# Patient Record
Sex: Female | Born: 2000 | Race: White | Hispanic: No | Marital: Single | Smoking: Never smoker
Health system: Southern US, Community
[De-identification: ages and names within clinical notes are randomized; demographics above are authoritative.]

## PROBLEM LIST (undated history)

## (undated) DIAGNOSIS — Q030 Malformations of aqueduct of Sylvius: Secondary | ICD-10-CM

---

## 2019-04-13 ENCOUNTER — Ambulatory Visit: Payer: Managed Care, Other (non HMO) | Attending: Internal Medicine

## 2019-04-13 DIAGNOSIS — Z20822 Contact with and (suspected) exposure to covid-19: Secondary | ICD-10-CM

## 2019-04-14 LAB — NOVEL CORONAVIRUS, NAA: SARS-CoV-2, NAA: NOT DETECTED

## 2021-02-27 ENCOUNTER — Encounter (HOSPITAL_BASED_OUTPATIENT_CLINIC_OR_DEPARTMENT_OTHER): Payer: Self-pay | Admitting: Emergency Medicine

## 2021-02-27 ENCOUNTER — Other Ambulatory Visit: Payer: Self-pay

## 2021-02-27 ENCOUNTER — Emergency Department (HOSPITAL_BASED_OUTPATIENT_CLINIC_OR_DEPARTMENT_OTHER)
Admission: EM | Admit: 2021-02-27 | Discharge: 2021-02-27 | Disposition: A | Discharge status: Home / Self Care | Payer: BC Managed Care – PPO | Attending: Emergency Medicine | Admitting: Emergency Medicine

## 2021-02-27 ENCOUNTER — Emergency Department (HOSPITAL_BASED_OUTPATIENT_CLINIC_OR_DEPARTMENT_OTHER): Payer: BC Managed Care – PPO

## 2021-02-27 DIAGNOSIS — R1013 Epigastric pain: Secondary | ICD-10-CM | POA: Diagnosis not present

## 2021-02-27 DIAGNOSIS — R112 Nausea with vomiting, unspecified: Secondary | ICD-10-CM | POA: Diagnosis not present

## 2021-02-27 HISTORY — DX: Malformations of aqueduct of Sylvius: Q03.0

## 2021-02-27 LAB — CBC
HCT: 48 % — ABNORMAL HIGH (ref 36.0–46.0)
Hemoglobin: 16.4 g/dL — ABNORMAL HIGH (ref 12.0–15.0)
MCH: 27.8 pg (ref 26.0–34.0)
MCHC: 34.2 g/dL (ref 30.0–36.0)
MCV: 81.5 fL (ref 80.0–100.0)
Platelets: 320 10*3/uL (ref 150–400)
RBC: 5.89 MIL/uL — ABNORMAL HIGH (ref 3.87–5.11)
RDW: 11.3 % — ABNORMAL LOW (ref 11.5–15.5)
WBC: 9.6 10*3/uL (ref 4.0–10.5)
nRBC: 0 % (ref 0.0–0.2)

## 2021-02-27 LAB — URINALYSIS, ROUTINE W REFLEX MICROSCOPIC
Glucose, UA: NEGATIVE mg/dL
Hgb urine dipstick: NEGATIVE
Ketones, ur: 40 mg/dL — AB
Nitrite: NEGATIVE
Specific Gravity, Urine: 1.02 (ref 1.005–1.030)
pH: 5.5 (ref 5.0–8.0)

## 2021-02-27 LAB — LIPASE, BLOOD: Lipase: 26 U/L (ref 11–51)

## 2021-02-27 LAB — COMPREHENSIVE METABOLIC PANEL
ALT: 28 U/L (ref 0–44)
AST: 21 U/L (ref 15–41)
Albumin: 5.2 g/dL — ABNORMAL HIGH (ref 3.5–5.0)
Alkaline Phosphatase: 52 U/L (ref 38–126)
Anion gap: 15 (ref 5–15)
BUN: 9 mg/dL (ref 6–20)
CO2: 27 mmol/L (ref 22–32)
Calcium: 10.3 mg/dL (ref 8.9–10.3)
Chloride: 94 mmol/L — ABNORMAL LOW (ref 98–111)
Creatinine, Ser: 0.86 mg/dL (ref 0.44–1.00)
GFR, Estimated: >60 mL/min (ref >60)
Glucose, Bld: 81 mg/dL (ref 70–99)
Potassium: 3 mmol/L — ABNORMAL LOW (ref 3.5–5.1)
Sodium: 136 mmol/L (ref 135–145)
Total Bilirubin: 0.6 mg/dL (ref 0.3–1.2)
Total Protein: 8.1 g/dL (ref 6.5–8.1)

## 2021-02-27 LAB — PREGNANCY, URINE: Preg Test, Ur: NEGATIVE

## 2021-02-27 LAB — MAGNESIUM: Magnesium: 2.1 mg/dL (ref 1.7–2.4)

## 2021-02-27 MED ORDER — IOHEXOL 300 MG/ML  SOLN
100.0000 mL | Freq: Once | INTRAMUSCULAR | Status: AC | PRN
  Administered 2021-02-27: 100 mL via INTRAVENOUS

## 2021-02-27 MED ORDER — ONDANSETRON HCL 4 MG/2ML IJ SOLN
4.0000 mg | Freq: Once | INTRAMUSCULAR | Status: AC
  Administered 2021-02-27: 4 mg via INTRAVENOUS
  Filled 2021-02-27: qty 2

## 2021-02-27 MED ORDER — POTASSIUM CHLORIDE CRYS ER 20 MEQ PO TBCR: 30.0000 meq | EXTENDED_RELEASE_TABLET | Freq: Once | ORAL | Status: DC

## 2021-02-27 MED ORDER — LACTATED RINGERS IV BOLUS
1000.0000 mL | Freq: Once | INTRAVENOUS | Status: AC
  Administered 2021-02-27: 1000 mL via INTRAVENOUS

## 2021-02-27 MED ORDER — POTASSIUM CHLORIDE 10 MEQ/100ML IV SOLN
10.0000 meq | INTRAVENOUS | Status: AC
  Administered 2021-02-27 (×3): 10 meq via INTRAVENOUS
  Filled 2021-02-27 (×3): qty 100

## 2021-02-27 MED ORDER — POTASSIUM CHLORIDE CRYS ER 20 MEQ PO TBCR
20.0000 meq | EXTENDED_RELEASE_TABLET | Freq: Once | ORAL | Status: AC
  Administered 2021-02-27: 20 meq via ORAL
  Filled 2021-02-27: qty 1

## 2021-02-27 MED ORDER — OMEPRAZOLE 20 MG PO CPDR
20.0000 mg | DELAYED_RELEASE_CAPSULE | Freq: Every day | ORAL | 0 refills | Status: AC
  Filled 2021-02-27: qty 30, 30d supply, fill #0

## 2021-02-27 NOTE — ED Triage Notes (Signed)
Pt arrives to ED with c/o emesis and nausea. This started x14 days ago. Pt reports >5 episodes of emesis per day. Pt does report intermittent nausea. Associated symptoms include abdominal cramping.

## 2021-02-27 NOTE — ED Notes (Signed)
Pt verbalizes understanding of discharge instructions. Opportunity for questioning and answers were provided. Pt discharged from ED to home.   ? ?

## 2021-02-27 NOTE — ED Provider Notes (Signed)
Patient no longer vomiting tolerating p.o. intake.  Potassium repleted here in the ER with multiple doses.  Repeat EKG appears to show normalized QTC.  Recommending outpatient follow-up with a primary care doctor within the week, advised immediate return for worsening symptoms or any additional concerns.   Cheryll Cockayne, MD 02/27/21 2318

## 2021-02-27 NOTE — ED Notes (Signed)
To CT

## 2021-02-27 NOTE — Discharge Instructions (Addendum)
You were seen here today for evaluation of your nausea and vomiting. Your lab work showed low potassium, otherwise normal.  We replenished your potassium while you were here.  Your EKG showed a prolonged QT which can happen with antinausea medications.  After giving you more potassium this problem appears to have improved.   However, because of this, please discontinue taking any antinausea medication such as but not limited to Zofran, Reglan, Phenergan as these may prolong your QT more and can cause cardiac arrhythmias. You can sniff alcohol pads if feeling nauseous. I have prescribed you Prilosec to help with any abdominal pain. I have included the information to a GI practice. Please call to schedule an appointment. If you have any concern, new or worsening symptoms, please return to the ER.

## 2021-02-27 NOTE — ED Provider Notes (Signed)
[facility] Provider Note   CSN: [id number] Arrival date & time: 02/27/21  1627     History Chief Complaint  Patient presents with   Emesis    Tamara Wall is a 21 y.o. female presents to the ED for evaluation of nausea and vomiting despite Phenergan and Reglan for the past 14 days.  She reports least 5 episodes per day.  She does endorse some coffee-ground emesis for a few episodes over the duration of 2 weeks.  She does report some lightheadedness, but no syncope.  She reports she has had some epigastric pain with this duration of symptoms that is episodic.  She denies any chest pain, shortness of breath, urinary symptoms, fever, diarrhea, constipation, or dark or tarry stools.  The patient reports that she had a similar episode to this 6 months ago that required hospitalization where they did a full work-up and could not find the cause of her vomiting. She was not given follow up to a GI provider.  For medical history she has an aqueductal stenosis.  No surgical history.  Daily medications include magnesium.  No known drug allergies.  Denies any tobacco or EtOH.  She does endorse some marijuana use although not on a consistent basis.   Emesis Associated symptoms: abdominal pain   Associated symptoms: no arthralgias, no chills, no cough, no diarrhea, no fever and no sore throat       Home Medications Prior to Admission medications   Medication Sig Start Date End Date Taking? Authorizing Provider  omeprazole (PRILOSEC) 20 MG capsule Take 1 capsule (20 mg total) by mouth daily. 02/27/21  Yes Sherrell Puller, PA-C      Allergies    Patient has no known allergies.    Review of Systems   Review of Systems  Constitutional:  Negative for chills and fever.  HENT:  Negative for ear pain and sore throat.   Eyes:  Negative for pain and visual disturbance.  Respiratory:  Negative for cough and shortness of breath.   Cardiovascular:  Negative for chest  pain and palpitations.  Gastrointestinal:  Positive for abdominal pain, nausea and vomiting. Negative for blood in stool, constipation and diarrhea.  Genitourinary:  Negative for dysuria and hematuria.  Musculoskeletal:  Negative for arthralgias and back pain.  Skin:  Negative for color change and rash.  Neurological:  Negative for seizures and syncope.  All other systems reviewed and are negative.  Physical Exam Updated Vital Signs BP 117/78    Pulse 85    Temp 98.2 F (36.8 C) (Oral)    Resp 18    Ht 5\' 3"  (1.6 m)    Wt 65.8 kg    LMP  (Approximate)    SpO2 99%    BMI 25.69 kg/m  Physical Exam Vitals and nursing note reviewed.  Constitutional:      General: She is not in acute distress.    Appearance: Normal appearance. She is not toxic-appearing.  HENT:     Head: Normocephalic and atraumatic.     Nose: Nose normal.     Mouth/Throat:     Mouth: Mucous membranes are dry.     Pharynx: No oropharyngeal exudate or posterior oropharyngeal erythema.  Eyes:     General: No scleral icterus. Cardiovascular:     Rate and Rhythm: Normal rate and regular rhythm.  Pulmonary:     Effort: Pulmonary effort is normal. No respiratory distress.     Breath sounds: Normal breath sounds.  Abdominal:  General: Abdomen is flat. Bowel sounds are normal.     Palpations: Abdomen is soft.     Tenderness: There is abdominal tenderness. There is no guarding or rebound.     Comments: Mild epigastric tenderness palpation without guarding or rebound.  Normoactive bowel sounds.  Musculoskeletal:        General: No deformity.     Cervical back: Normal range of motion.  Skin:    General: Skin is warm and dry.  Neurological:     General: No focal deficit present.     Mental Status: She is alert. Mental status is at baseline.    ED Results / Procedures / Treatments   Labs (all labs ordered are listed, but only abnormal results are displayed) Labs Reviewed  COMPREHENSIVE METABOLIC PANEL - Abnormal;  Notable for the following components:      Result Value   Potassium 3.0 (*)    Chloride 94 (*)    Albumin 5.2 (*)    All other components within normal limits  CBC - Abnormal; Notable for the following components:   RBC 5.89 (*)    Hemoglobin 16.4 (*)    HCT 48.0 (*)    RDW 11.3 (*)    All other components within normal limits  URINALYSIS, ROUTINE W REFLEX MICROSCOPIC - Abnormal; Notable for the following components:   Bilirubin Urine SMALL (*)    Ketones, ur 40 (*)    Protein, ur TRACE (*)    Leukocytes,Ua SMALL (*)    All other components within normal limits  LIPASE, BLOOD  PREGNANCY, URINE  MAGNESIUM    EKG EKG Interpretation  Date/Time:  Monday February 27 2021 18:20:20 EST Ventricular Rate:  97 PR Interval:    QRS Duration: 99 QT Interval:  417 QTC Calculation: 530 R Axis:   106 Text Interpretation: Atrial fibrillation Borderline right axis deviation Abnormal T, consider ischemia, inferior leads Prolonged QT interval Confirmed by Thamas Jaegers ([id number]) on 02/27/2021 7:16:15 PM  Radiology CT ABDOMEN PELVIS W CONTRAST  Result Date: 02/27/2021 CLINICAL DATA:  Abdominal pain EXAM: CT ABDOMEN AND PELVIS WITH CONTRAST TECHNIQUE: Multidetector CT imaging of the abdomen and pelvis was performed using the standard protocol following bolus administration of intravenous contrast. CONTRAST:  131mL OMNIPAQUE IOHEXOL 300 MG/ML  SOLN COMPARISON:  None. FINDINGS: Lower chest: No acute abnormality. Hepatobiliary: Focal fatty infiltration seen at the falciform ligament. No suspicious focal liver abnormality is seen. No gallstones, gallbladder wall thickening, or biliary dilatation. Pancreas: Unremarkable. No pancreatic ductal dilatation or surrounding inflammatory changes. Spleen: Normal in size without focal abnormality. Adrenals/Urinary Tract: Bilateral adrenal glands are unremarkable. Kidneys enhance symmetrically with no evidence of hydronephrosis or nephrolithiasis. Small low-attenuation  renal lesions which are too small to completely characterize. Bladder is unremarkable. Stomach/Bowel: Stomach is within normal limits. Appendix appears normal. Areas of hyperdensity are scattered throughout the colon which are favored to be due to ingested material, possibly retained intraluminal contrast from prior procedure. No evidence of bowel wall thickening, distention, or inflammatory changes. Vascular/Lymphatic: No significant vascular findings are present. No enlarged abdominal or pelvic lymph nodes. Reproductive: Uterus and bilateral adnexa are unremarkable. Other: No abdominal wall hernia or abnormality. No abdominopelvic ascites. Musculoskeletal: No acute or significant osseous findings. IMPRESSION: No acute findings in the abdomen or pelvis. Electronically Signed   By: Yetta Glassman M.D.   On: 02/27/2021 18:26    Procedures Procedures  Normotensive, afebrile, normal pulse rate, satting 99% on room air without increased work of breathing.  Her  EKG does show QT prolongation.  Medications Ordered in ED Medications  potassium chloride 10 mEq in 100 mL IVPB (10 mEq Intravenous New Bag/Given 02/27/21 2106)  lactated ringers bolus 1,000 mL (0 mLs Intravenous Stopped 02/27/21 2001)  ondansetron (ZOFRAN) injection 4 mg (4 mg Intravenous Given 02/27/21 1743)  iohexol (OMNIPAQUE) 300 MG/ML solution 100 mL (100 mLs Intravenous Contrast Given 02/27/21 1808)  potassium chloride SA (KLOR-CON M) CR tablet 20 mEq (20 mEq Oral Given 02/27/21 08-08-1998)    ED Course/ Medical Decision Making/ A&P                           Medical Decision Making  21 year old female presents to the emergency department for evaluation of nausea and vomiting along with epigastric abdominal pain for the past 2 weeks.  Differential diagnosis includes is not limited to pancreatitis, cholecystitis, cholangitis, cholelithiasis, SBO, viral gastroenteritis, vomiting syndrome, cannabinoid induced hyperemesis syndrome.  Vital signs show blood  pressure is normal, afebrile, normal pulse rate, satting 99% on room air.  His exam shows mild epigastric tenderness palpation however there is no guarding or rebound.  Normoactive bowel sounds.  Dry mucous membranes.  Patient is well-appearing.  Labs and imaging ordered.  Labs show normal magnesium of 2.1.  Negative pregnancy test.  CMP shows hypokalemia at 3.0 decrease in chloride.  Normal LFTs.  Normal creatinine.  Lipase normal.  CBC shows hemoconcentration likely due to patient's dehydration from vomiting.  Urinalysis shows ketones with trace protein and small leukocytes consistent with dehydration again.  CT abdomen shows no acute findings in the abdomen or pelvis.  No SBO or fluid collection.  No inflammatory changes.  The patient's story and her supporting labs of dehydration, will give 1 L of LR and replenish her potassium with 20 mill equivalents orally as well as 10 mill equivalents per hour for the next 3 hours.  Zofran given.  EKG shows QT prolongation, so discontinued and additional antiemetics.  Discussed this with the patient and advised her to stop taking any Reglan, Phenergan or any other antiemetic medications she was given with her last discharge as is can increase her daily for ligation sending her into a cardiac arrhythmia.  Patient expresses understanding and states that she will stop.  On reevaluation, the patient reports she is feeling better after the fluids.  We will p.o. challenge.  She was given crackers and soda.  On reevaluation, the patient has not had any vomiting and does not endorse any nausea.  No vomiting.  At this time, the patient has passed her p.o. challenge has not had any emesis since arriving to the emergency department.  She is in need of an addition dose of potassium in an hour.  Will handoff patient at this time.  9:48 PM Care of [facility]  transferred to  Dr. Laverta Baltimore at the end of my shift as the patient will require reassessment once labs/imaging  have resulted. Patient presentation, ED course, and plan of care discussed with review of all pertinent labs and imaging. Please see his/her note for further details regarding further ED course and disposition. Plan at time of handoff is discharge home after third dose of potassium pending that she does ntot have any additional vomiting episodes. Protonix prescription sent in. This may be altered or completely changed at the discretion of the oncoming team pending results of further workup.  Final Clinical Impression(s) / ED Diagnoses Final diagnoses:  Nausea and vomiting,  unspecified vomiting type    Rx / DC Orders ED Discharge Orders          Ordered    omeprazole (PRILOSEC) 20 MG capsule  Daily        02/27/21 2138              Sherrell Puller, PA-C 02/27/21 2148    Luna Fuse, MD 02/27/21 2318

## 2021-02-28 ENCOUNTER — Other Ambulatory Visit (HOSPITAL_BASED_OUTPATIENT_CLINIC_OR_DEPARTMENT_OTHER): Payer: Self-pay

## 2021-03-14 ENCOUNTER — Other Ambulatory Visit (HOSPITAL_BASED_OUTPATIENT_CLINIC_OR_DEPARTMENT_OTHER): Payer: Self-pay

## 2021-04-06 ENCOUNTER — Other Ambulatory Visit: Payer: Self-pay | Admitting: Physician Assistant

## 2021-04-06 DIAGNOSIS — R112 Nausea with vomiting, unspecified: Secondary | ICD-10-CM

## 2021-04-13 ENCOUNTER — Ambulatory Visit
Admission: RE | Admit: 2021-04-13 | Discharge: 2021-04-13 | Disposition: A | Discharge status: Home / Self Care | Payer: BC Managed Care – PPO | Source: Ambulatory Visit | Attending: Physician Assistant | Admitting: Physician Assistant

## 2021-04-13 DIAGNOSIS — R112 Nausea with vomiting, unspecified: Secondary | ICD-10-CM

## 2023-03-01 IMAGING — US US ABDOMEN LIMITED
1 series · 14 of 25 positions shown · non-contrast
Comparison: CT abdomen pelvis 02/27/2021

CLINICAL DATA: Nausea and vomiting with right-sided abdominal pain
for several months.

EXAM:
ULTRASOUND ABDOMEN LIMITED RIGHT UPPER QUADRANT

[Series 1: us abdomen limited · 0.17mm/px · 14 of 45 slices shown]
[im 1/45]
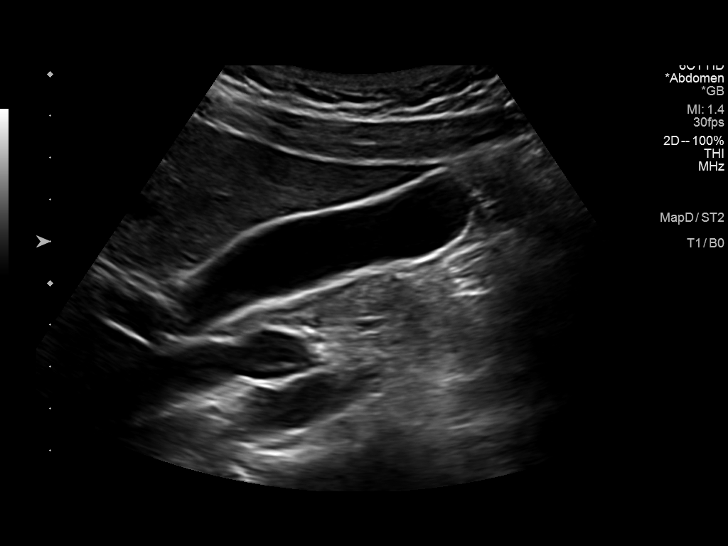
[im 4/45]
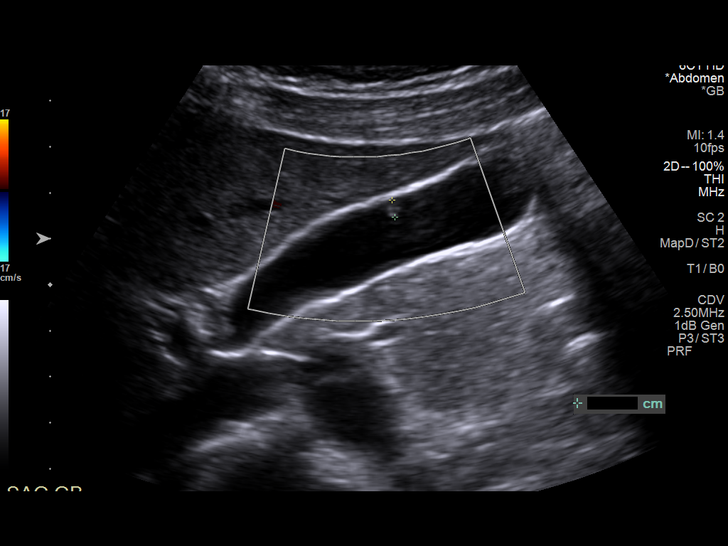
[im 8/45]
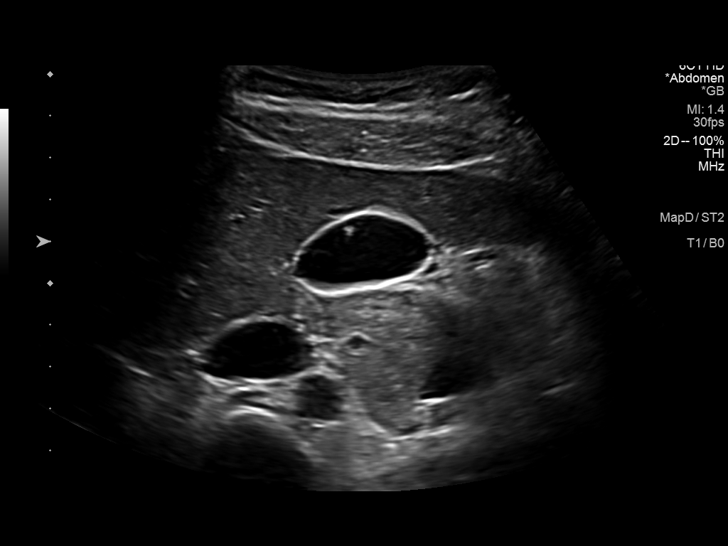
[im 12/45]
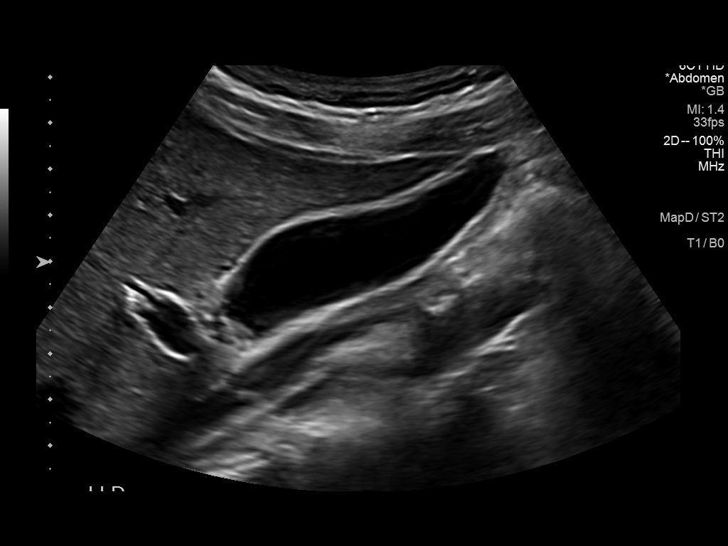
[im 15/45]
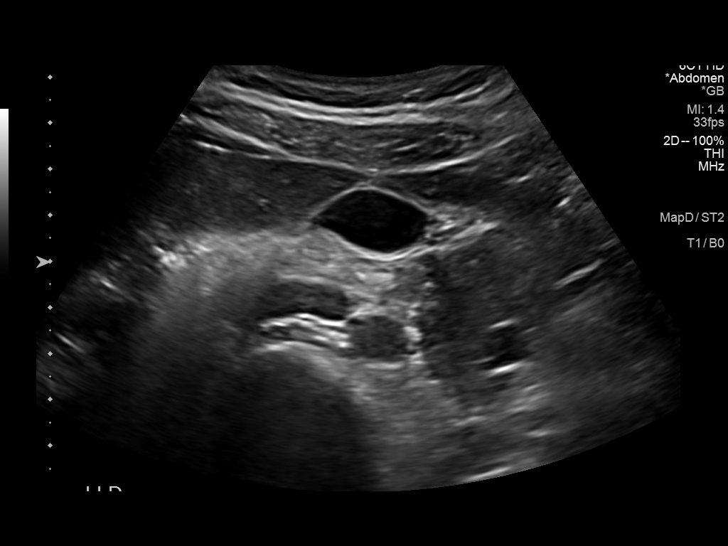
[im 17/45]
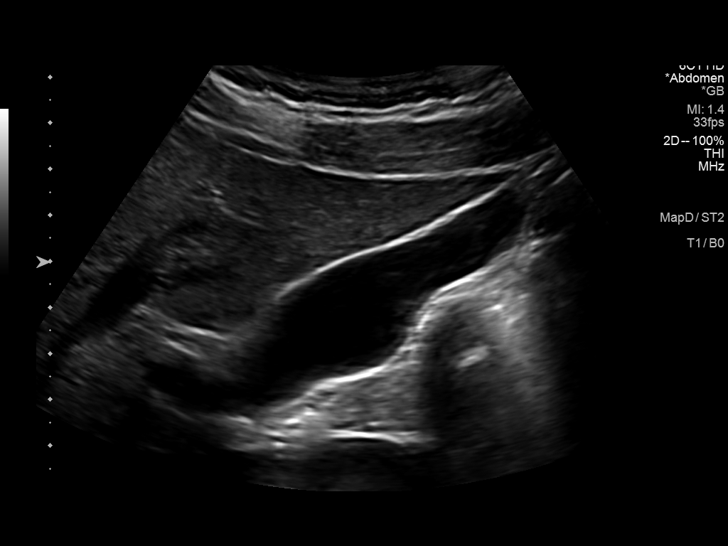
[im 21/45]
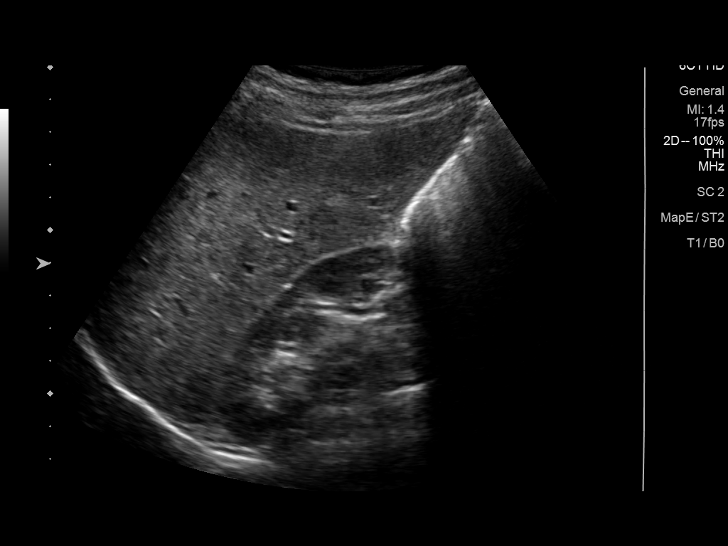
[im 24/45]
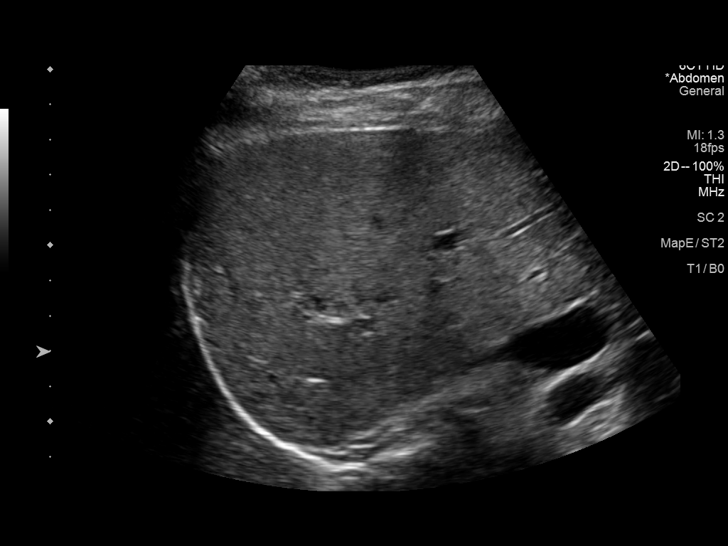
[im 28/45]
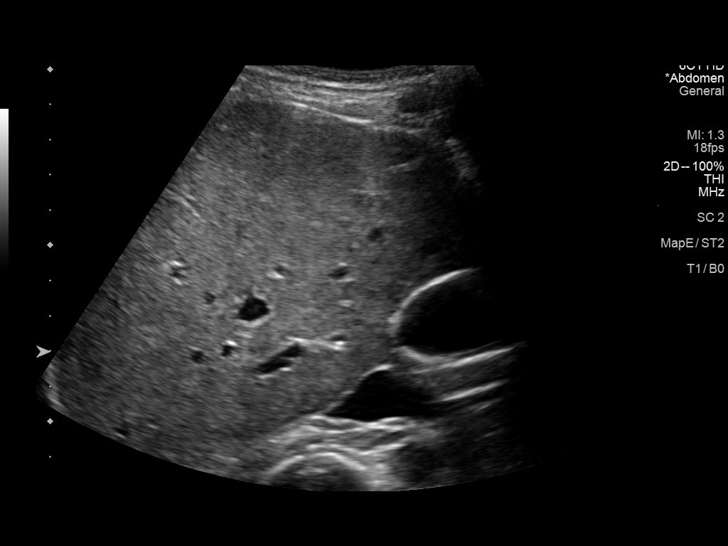
[im 30/45]
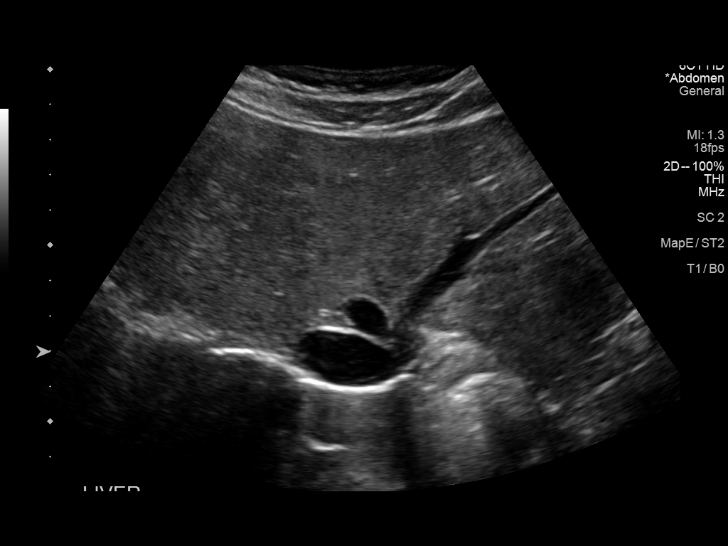
[im 34/45]
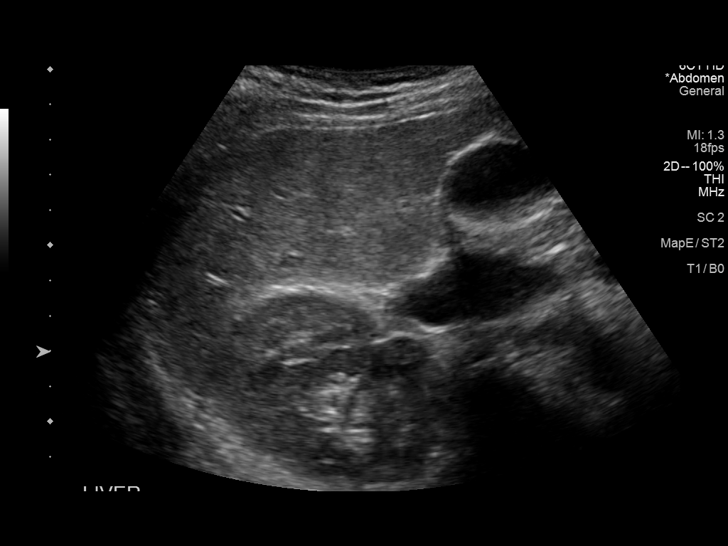
[im 37/45]
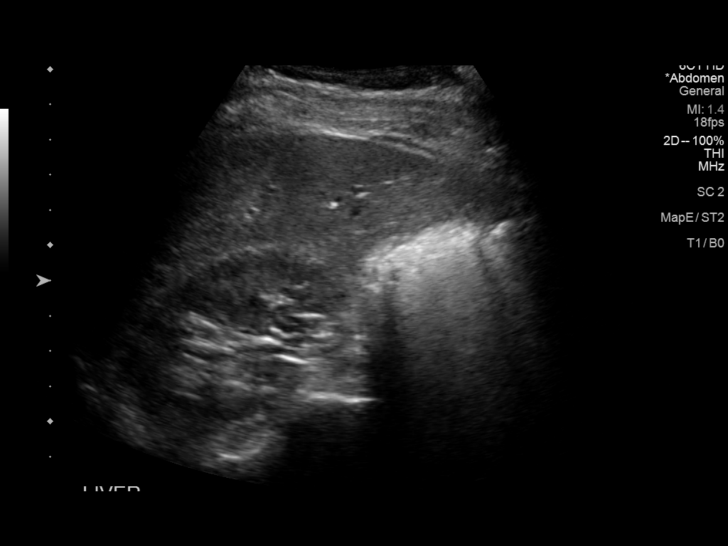
[im 41/45]
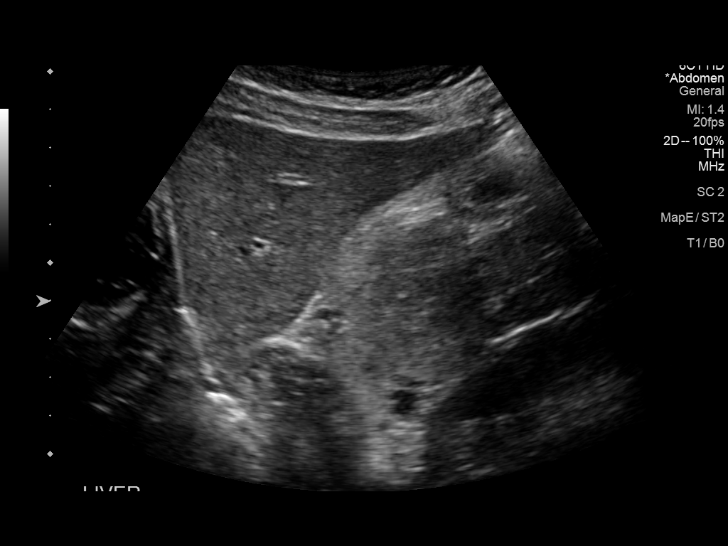
[im 45/45]
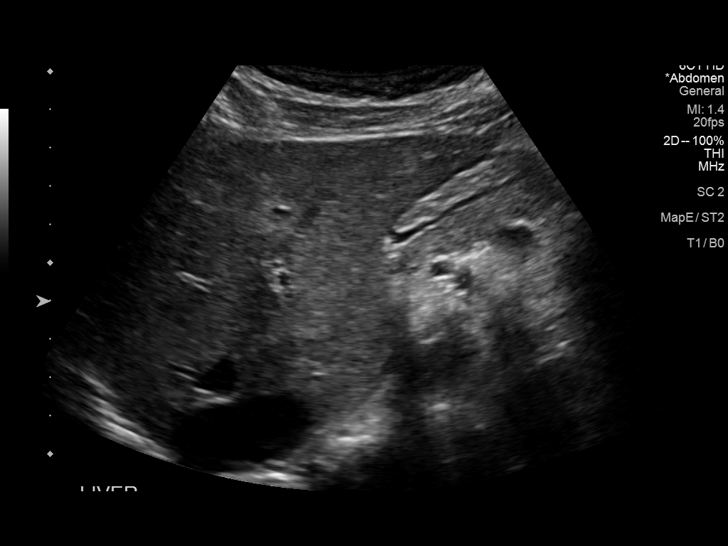

[14 of 25 positions shown; findings below may reference images not displayed]

FINDINGS: Gallbladder:

No gallstones or wall thickening visualized. There is a benign
cm polyp. No sonographic Murphy sign noted by sonographer.

Common bile duct:

Diameter: 0.3 cm, within normal limits

Liver:

No focal lesion identified. Within normal limits in parenchymal
echogenicity. Portal vein is patent on color Doppler imaging with
normal direction of blood flow towards the liver.

Other: None.
IMPRESSION: 1.  No sonographic finding to explain the patient's abdominal pain.

2.  Benign 4 mm gallbladder polyp.
